# Patient Record
Sex: Male | Born: 1990 | Race: White | Hispanic: No | Marital: Married | Smoking: Former smoker
Health system: Southern US, Community
[De-identification: ages and names within clinical notes are randomized; demographics above are authoritative.]

## PROBLEM LIST (undated history)

## (undated) DIAGNOSIS — E063 Autoimmune thyroiditis: Secondary | ICD-10-CM

## (undated) DIAGNOSIS — J189 Pneumonia, unspecified organism: Secondary | ICD-10-CM

## (undated) DIAGNOSIS — M109 Gout, unspecified: Secondary | ICD-10-CM

## (undated) DIAGNOSIS — Z87442 Personal history of urinary calculi: Secondary | ICD-10-CM

## (undated) DIAGNOSIS — L0591 Pilonidal cyst without abscess: Secondary | ICD-10-CM

## (undated) DIAGNOSIS — I10 Essential (primary) hypertension: Secondary | ICD-10-CM

## (undated) DIAGNOSIS — F419 Anxiety disorder, unspecified: Secondary | ICD-10-CM

## (undated) DIAGNOSIS — R519 Headache, unspecified: Secondary | ICD-10-CM

## (undated) HISTORY — PX: WISDOM TOOTH EXTRACTION: SHX21

---

## 2018-04-11 ENCOUNTER — Encounter: Payer: Self-pay | Admitting: "Endocrinology

## 2018-04-11 ENCOUNTER — Ambulatory Visit (INDEPENDENT_AMBULATORY_CARE_PROVIDER_SITE_OTHER): Payer: Managed Care, Other (non HMO) | Admitting: "Endocrinology

## 2018-04-11 VITALS — BP 125/80 | HR 92 | Ht 74.25 in | Wt 338.0 lb

## 2018-04-11 DIAGNOSIS — E0590 Thyrotoxicosis, unspecified without thyrotoxic crisis or storm: Secondary | ICD-10-CM | POA: Insufficient documentation

## 2018-04-11 MED ORDER — PROPRANOLOL HCL 20 MG PO TABS: 20.0000 mg | ORAL_TABLET | Freq: Two times a day (BID) | ORAL | 2 refills | Status: DC

## 2018-04-11 NOTE — Patient Instructions (Signed)

## 2018-04-11 NOTE — Progress Notes (Signed)
Endocrinology Consult Note                                            04/11/2018, 3:39 PM   Subjective:    Patient ID: Geoffrey Dalton, male    DOB: 1991-07-11, PCP Vilinda Boehringer, MD   History reviewed. No pertinent past medical history. History reviewed. No pertinent surgical history. Social History   Socioeconomic History  . Marital status: Married    Spouse name: Not on file  . Number of children: Not on file  . Years of education: Not on file  . Highest education level: Not on file  Occupational History  . Not on file  Social Needs  . Financial resource strain: Not on file  . Food insecurity:    Worry: Not on file    Inability: Not on file  . Transportation needs:    Medical: Not on file    Non-medical: Not on file  Tobacco Use  . Smoking status: Former Smoker    Packs/day: 1.00    Years: 9.00    Pack years: 9.00    Types: Cigarettes    Last attempt to quit: 10/12/2012    Years since quitting: 5.4  . Smokeless tobacco: Never Used  Substance and Sexual Activity  . Alcohol use: Not on file  . Drug use: Not on file  . Sexual activity: Not on file  Lifestyle  . Physical activity:    Days per week: Not on file    Minutes per session: Not on file  . Stress: Not on file  Relationships  . Social connections:    Talks on phone: Not on file    Gets together: Not on file    Attends religious service: Not on file    Active member of club or organization: Not on file    Attends meetings of clubs or organizations: Not on file    Relationship status: Not on file  Other Topics Concern  . Not on file  Social History Narrative  . Not on file   Outpatient Encounter Medications as of 04/11/2018  Medication Sig  . amphetamine-dextroamphetamine (ADDERALL) 30 MG tablet daily.  . propranolol (INDERAL) 20 MG tablet Take 1 tablet (20 mg total) by mouth 2 (two) times daily.  . sertraline (ZOLOFT) 100 MG tablet daily.   No facility-administered encounter medications on  file as of 04/11/2018.    ALLERGIES: Allergies  Allergen Reactions  . Oxycodone-Acetaminophen Anaphylaxis  . Morphine And Related     Other reaction(s): Other (See Comments) Respiratory Depression Respiratory Depression     VACCINATION STATUS:  There is no immunization history on file for this patient.  HPI Geoffrey Dalton is 27 y.o. male who presents today with a medical history as above. he is being seen in consultation for hyperthyroidism requested by Vilinda Boehringer, MD.  he has been dealing with symptoms of palpitations, heat intolerance, tremors on and off for approximately 18 months. -He has gained significant amount of weight, approximately 50 pounds over a year. -He has family history of thyroid dysfunction in multiple family members including his mother, grandparents, and great aunts. -He was diagnosed with hyperthyroidism with Graves' disease in [facility] in 2018, did not require antithyroid ablative treatment except brief period of beta-blocker treatment. -He does not have recent thyroid function tests to review.  In  May 2018 he did have T3 toxicosis with free T3 of 3.81, free T4 0.7, TSH 2.8.  -He denies dysphagia, shortness of breath, nor voice change.  -  He was found to have multinodular goiter based on ultrasound in April 2018-summarized below.  Review of Systems  Constitutional: + weight gain, no fatigue, + subjective hyperthermia, no subjective hypothermia Eyes: no blurry vision, no xerophthalmia ENT: no sore throat, no nodules palpated in throat, no dysphagia/odynophagia, no hoarseness Cardiovascular: no Chest Pain, no Shortness of Breath, + palpitations, no leg swelling Respiratory: no cough, no SOB Gastrointestinal: no Nausea/Vomiting/Diarhhea Musculoskeletal: no muscle/joint aches Skin: no rashes Neurological: no tremors, no numbness, no tingling, no dizziness Psychiatric: no depression, + anxiety  Objective:    BP 125/80   Pulse 92    Ht 6' 2.25" (1.886 m)   Wt (!) 338 lb (153.3 kg)   BMI 43.10 kg/m   Wt Readings from Last 3 Encounters:  04/11/18 (!) 338 lb (153.3 kg)    Physical Exam  Constitutional: + Obese,, not in acute distress, normal state of mind Eyes: PERRLA, EOMI, no exophthalmos ENT: moist mucous membranes, no thyromegaly, no cervical lymphadenopathy Cardiovascular: normal precordial activity, + mild tachycardia, no Murmur/Rubs/Gallops Respiratory:  adequate breathing efforts, no gross chest deformity, Clear to auscultation bilaterally Gastrointestinal: abdomen soft, Non -tender, No distension, Bowel Sounds present Musculoskeletal: no gross deformities, strength intact in all four extremities Skin: moist, warm, no rashes Neurological: + tremor with outstretched hands, Deep tendon reflexes are brisk in bilateral lower extremities.   Dec 20, 2016 free T3 elevated 3.81, TSH normal 2.8, free T4 low normal 0.7. TPO antibodies elevated at 121, thyroglobulin but is elevated at 76.  February 01, 2017 thyroid uptake and scan showed 20-hour capture of 21.7% (normal 20-30%). Ultrasound in November 30, 2016 showed right lobe 5.2 cm with 2 nodules measuring 1.4 cm and 1.2 cm.  Left lobe measured 4.5 cm with no nodules.   Assessment & Plan:   1. Hyperthyroidism  This pleasant patient is being seen at the kind request of Dr. Vilinda BoehringerJames Short. His history and available remote labs are reviewed. He was examined clinically. Subjective and objective findings are consistent with thyrotoxicosis likely from primary hyperthyroidism.   The potential risks of untreated thyrotoxicosis and the need for definitive therapy have been discussed  with him, and he agrees to proceed with diagnostic workup and treatment plan.   I like to repeat full profile thyroid function tests today and he may need confirmatory thyroid uptake if the labs are suggestive.      Options of therapy for hyperthyroidism are briefly discussed with him.   -If he is  confirmed to have primary hyperthyroidism the option of definitive therapy with RAI ablation of the thyroid is best for him.  -In the meantime, I discussed and prescribed propranolol 20 mg p.o. twice daily for symptomatic relief.  -Regarding his significant weight gain, which is likely related to his hyperthyroidism, I discussed lifestyle modifications with him.  -  Suggestion is made for him to avoid simple carbohydrates  from his diet including Cakes, Sweet Desserts / Pastries, Ice Cream, Soda (diet and regular), Sweet Tea, Candies, Chips, Cookies, Store Bought Juices, Alcohol in Excess of  1-2 drinks a day, Artificial Sweeteners, and "Sugar-free" Products. This will help patient to have stable blood glucose profile and potentially avoid unintended weight gain.  Overall signs of his thyroid is appropriate for his body size, however if he does not have hyperthyroidism, he will  need repeat thyroid ultrasound to monitor the size of the 2 nodules in the right lobe of his thyroid.  - I advised him  to maintain close follow up with Vilinda Boehringer, MD for primary care needs.  Geoffrey Dalton participated in the discussions, expressed understanding, and voiced agreement with the above plans.  All questions were answered to his satisfaction. he is encouraged to contact clinic should he have any questions or concerns prior to his return visit.  Follow up plan: Return in about 2 weeks (around 04/25/2018) for Labs Today- Non-Fasting Ok.   Marquis Lunch, MD [facility] [address] [place], [place] [address] Phone: [phone]  Fax: [phone]     04/11/2018, 3:39 PM  This note was partially dictated with voice recognition software. Similar sounding words can be transcribed inadequately or may not  be corrected upon review.

## 2018-04-12 ENCOUNTER — Other Ambulatory Visit: Payer: Self-pay | Admitting: "Endocrinology

## 2018-04-13 LAB — TSH: TSH: 3.05 u[IU]/mL (ref 0.450–4.500)

## 2018-04-13 LAB — T3, FREE: T3 FREE: 3.4 pg/mL (ref 2.0–4.4)

## 2018-04-13 LAB — T4, FREE: FREE T4: 1.06 ng/dL (ref 0.82–1.77)

## 2018-04-25 ENCOUNTER — Encounter: Payer: Self-pay | Admitting: "Endocrinology

## 2018-04-25 ENCOUNTER — Ambulatory Visit (INDEPENDENT_AMBULATORY_CARE_PROVIDER_SITE_OTHER): Payer: Managed Care, Other (non HMO) | Admitting: "Endocrinology

## 2018-04-25 VITALS — BP 137/83 | HR 73 | Ht 74.25 in | Wt 333.0 lb

## 2018-04-25 DIAGNOSIS — E0590 Thyrotoxicosis, unspecified without thyrotoxic crisis or storm: Secondary | ICD-10-CM

## 2018-04-25 NOTE — Progress Notes (Signed)
Endocrinology follow-up  Note                                            04/25/2018, 4:45 PM   Subjective:    Patient ID: Geoffrey Dalton, male    DOB: 1991/06/09, PCP Vilinda Boehringer, MD   History reviewed. No pertinent past medical history. History reviewed. No pertinent surgical history. Social History   Socioeconomic History  . Marital status: Married    Spouse name: Not on file  . Number of children: Not on file  . Years of education: Not on file  . Highest education level: Not on file  Occupational History  . Not on file  Social Needs  . Financial resource strain: Not on file  . Food insecurity:    Worry: Not on file    Inability: Not on file  . Transportation needs:    Medical: Not on file    Non-medical: Not on file  Tobacco Use  . Smoking status: Former Smoker    Packs/day: 1.00    Years: 9.00    Pack years: 9.00    Types: Cigarettes    Last attempt to quit: 10/12/2012    Years since quitting: 5.5  . Smokeless tobacco: Never Used  Substance and Sexual Activity  . Alcohol use: Not Currently  . Drug use: Never  . Sexual activity: Not on file  Lifestyle  . Physical activity:    Days per week: Not on file    Minutes per session: Not on file  . Stress: Not on file  Relationships  . Social connections:    Talks on phone: Not on file    Gets together: Not on file    Attends religious service: Not on file    Active member of club or organization: Not on file    Attends meetings of clubs or organizations: Not on file    Relationship status: Not on file  Other Topics Concern  . Not on file  Social History Narrative  . Not on file   Outpatient Encounter Medications as of 04/25/2018  Medication Sig  . amphetamine-dextroamphetamine (ADDERALL) 30 MG tablet daily.  . propranolol (INDERAL) 20 MG tablet Take 1 tablet (20 mg total) by mouth 2 (two) times daily.  . sertraline (ZOLOFT) 100 MG tablet daily.   No facility-administered encounter medications on  file as of 04/25/2018.    ALLERGIES: Allergies  Allergen Reactions  . Oxycodone-Acetaminophen Anaphylaxis  . Morphine And Related     Other reaction(s): Other (See Comments) Respiratory Depression Respiratory Depression     VACCINATION STATUS:  There is no immunization history on file for this patient.  HPI Geoffrey Dalton is 27 y.o. male who presents today with a medical history as above. he is returning with thyroid function tests after he was seen in consultation for history of hyperthyroidism requested by Vilinda Boehringer, MD.  he has been dealing with symptoms of palpitations, heat intolerance, tremors on and off for approximately 18 months.  Please symptoms have largely been subsiding. -He has lost approximately 5 pounds since last visit. -He has family history of thyroid dysfunction in multiple family members including his mother, grandparents, and great aunts. -He was diagnosed with hyperthyroidism with Graves' disease in [facility] in 2018, did not require antithyroid ablative treatment except brief period of beta-blocker treatment. His 24-hour  thyroid uptake was 21%. -His previsit thyroid function tests are within normal limits. -Continues to complain of fatigue, inability to lose weight, diffuse arthralgias.  -He denies dysphagia, shortness of breath, nor voice change.  -  He was found to have multinodular goiter based on ultrasound in April 2018-summarized below.  Review of Systems  Constitutional: + weight loss after significant history of weight gain over the past several months,  + fatigue, + subjective hyperthermia, no subjective hypothermia Eyes: no blurry vision, no xerophthalmia ENT: no sore throat, no nodules palpated in throat, no dysphagia/odynophagia, no hoarseness Cardiovascular: no Chest Pain, no Shortness of Breath, + palpitations, no leg swelling Respiratory: no cough, no SOB Gastrointestinal: no Nausea/Vomiting/Diarhhea Musculoskeletal:  no muscle/joint aches Skin: no rashes Neurological: no tremors, no numbness, no tingling, no dizziness Psychiatric: no depression, + anxiety  Objective:    BP 137/83   Pulse 73   Ht 6' 2.25" (1.886 m)   Wt (!) 333 lb (151 kg)   BMI 42.47 kg/m   Wt Readings from Last 3 Encounters:  04/25/18 (!) 333 lb (151 kg)  04/11/18 (!) 338 lb (153.3 kg)    Physical Exam  Constitutional: + Obese,, not in acute distress, normal state of mind Eyes: PERRLA, EOMI, no exophthalmos ENT: moist mucous membranes, no thyromegaly, no cervical lymphadenopathy  Musculoskeletal: no gross deformities, strength intact in all four extremities Skin: moist, warm, no rashes Neurological: + tremor with outstretched hands, Deep tendon reflexes are brisk in bilateral lower extremities.   Dec 20, 2016 free T3 elevated 3.81, TSH normal 2.8, free T4 low normal 0.7. TPO antibodies elevated at 121, thyroglobulin but is elevated at 76.  February 01, 2017 thyroid uptake and scan showed 20-hour capture of 21.7% (normal 20-30%). Ultrasound in November 30, 2016 showed right lobe 5.2 cm with 2 nodules measuring 1.4 cm and 1.2 cm.  Left lobe measured 4.5 cm with no nodules. Recent Results (from the past 2160 hour(s))  T4, free     Status: None   Collection Time: 04/12/18 11:44 AM  Result Value Ref Range   Free T4 1.06 0.82 - 1.77 ng/dL  TSH     Status: None   Collection Time: 04/12/18 11:44 AM  Result Value Ref Range   TSH 3.050 0.450 - 4.500 uIU/mL  T3, free     Status: None   Collection Time: 04/12/18 11:44 AM  Result Value Ref Range   T3, Free 3.4 2.0 - 4.4 pg/mL     Assessment & Plan:   1. Hyperthyroidism-history of -His previsit labs are within normal limits. -He has previous evidence of antithyroid antibodies with elevated TPO and thyroglobulin antibodies. -His clinical course appears to include transient phase of thyrotoxicosis which has progressed to euthyroidism at this time. -Based on his thyroid function  tests, he would not require antithyroid intervention at this time.  I approached him to keep an appointment with repeat thyroid function test in 4 months.  -Regarding his fatigue, I had offered him screening for hypogonadism with fasting a.m. testosterone as well as A1c and 25 hydroxy vitamin D levels. - If these labs are abnormal, he would be called for intervention if necessary.    -Regarding his significant weight gain, which is likely related to his hyperthyroidism, I discussed lifestyle modifications with him.  -  Suggestion is made for him to avoid simple carbohydrates  from his diet including Cakes, Sweet Desserts / Pastries, Ice Cream, Soda (diet and regular), Sweet Tea, Candies, Chips, Cookies, Store Bought  Juices, Alcohol in Excess of  1-2 drinks a day, Artificial Sweeteners, and "Sugar-free" Products. This will help patient to have stable blood glucose profile and potentially avoid unintended weight gain.  Overall size of his thyroid is appropriate for his body size, he will need repeat thyroid ultrasound to monitor the size of the 2 nodules in the right lobe of his thyroid in 1 year.  - I advised him  to maintain close follow up with Vilinda Boehringer, MD for primary care needs.    Follow up plan: Return in about 4 months (around 08/25/2018), or Separate labs this week fasting, for Follow up with Pre-visit Labs.   Marquis Lunch, MD [facility] [address] [place], [place] [address] Phone: [phone]  Fax: [phone]     04/25/2018, 4:45 PM  This note was partially dictated with voice recognition software. Similar sounding words can be transcribed inadequately or may not  be corrected upon review.

## 2018-04-25 NOTE — Patient Instructions (Signed)

## 2018-04-26 ENCOUNTER — Other Ambulatory Visit: Payer: Self-pay | Admitting: "Endocrinology

## 2018-04-27 LAB — VITAMIN D 25 HYDROXY (VIT D DEFICIENCY, FRACTURES): VIT D 25 HYDROXY: 29.2 ng/mL — AB (ref 30.0–100.0)

## 2018-04-27 LAB — COMPREHENSIVE METABOLIC PANEL
A/G RATIO: 1.5 (ref 1.2–2.2)
ALT: 30 IU/L (ref 0–44)
AST: 27 IU/L (ref 0–40)
Albumin: 4.4 g/dL (ref 3.5–5.5)
Alkaline Phosphatase: 51 IU/L (ref 39–117)
BILIRUBIN TOTAL: 0.4 mg/dL (ref 0.0–1.2)
BUN/Creatinine Ratio: 15 (ref 9–20)
BUN: 18 mg/dL (ref 6–20)
CHLORIDE: 99 mmol/L (ref 96–106)
CO2: 23 mmol/L (ref 20–29)
Calcium: 9.4 mg/dL (ref 8.7–10.2)
Creatinine, Ser: 1.17 mg/dL (ref 0.76–1.27)
GFR calc non Af Amer: 84 mL/min/{1.73_m2} (ref >59)
GFR, EST AFRICAN AMERICAN: 98 mL/min/{1.73_m2} (ref >59)
Globulin, Total: 3 g/dL (ref 1.5–4.5)
Glucose: 102 mg/dL — ABNORMAL HIGH (ref 65–99)
POTASSIUM: 4 mmol/L (ref 3.5–5.2)
Sodium: 139 mmol/L (ref 134–144)
Total Protein: 7.4 g/dL (ref 6.0–8.5)

## 2018-04-27 LAB — LIPID PANEL W/O CHOL/HDL RATIO
CHOLESTEROL TOTAL: 165 mg/dL (ref 100–199)
HDL: 39 mg/dL — AB (ref >39)
LDL Calculated: 94 mg/dL (ref 0–99)
TRIGLYCERIDES: 158 mg/dL — AB (ref 0–149)
VLDL Cholesterol Cal: 32 mg/dL (ref 5–40)

## 2018-04-27 LAB — HGB A1C W/O EAG: Hgb A1c MFr Bld: 5.4 % (ref 4.8–5.6)

## 2018-08-29 ENCOUNTER — Ambulatory Visit: Payer: Managed Care, Other (non HMO) | Admitting: "Endocrinology

## 2020-08-18 ENCOUNTER — Other Ambulatory Visit: Payer: Self-pay

## 2020-08-18 ENCOUNTER — Emergency Department
Admission: EM | Admit: 2020-08-18 | Discharge: 2020-08-18 | Disposition: A | Discharge status: Home / Self Care | Payer: Managed Care, Other (non HMO) | Attending: Emergency Medicine | Admitting: Emergency Medicine

## 2020-08-18 ENCOUNTER — Encounter: Payer: Self-pay | Admitting: Emergency Medicine

## 2020-08-18 ENCOUNTER — Emergency Department: Payer: Managed Care, Other (non HMO)

## 2020-08-18 DIAGNOSIS — U071 COVID-19: Secondary | ICD-10-CM | POA: Diagnosis not present

## 2020-08-18 DIAGNOSIS — R059 Cough, unspecified: Secondary | ICD-10-CM | POA: Diagnosis present

## 2020-08-18 DIAGNOSIS — Z87891 Personal history of nicotine dependence: Secondary | ICD-10-CM | POA: Insufficient documentation

## 2020-08-18 DIAGNOSIS — E039 Hypothyroidism, unspecified: Secondary | ICD-10-CM | POA: Insufficient documentation

## 2020-08-18 DIAGNOSIS — J111 Influenza due to unidentified influenza virus with other respiratory manifestations: Secondary | ICD-10-CM

## 2020-08-18 HISTORY — DX: Autoimmune thyroiditis: E06.3

## 2020-08-18 LAB — CBC
HCT: 48.3 % (ref 39.0–52.0)
Hemoglobin: 15.7 g/dL (ref 13.0–17.0)
MCH: 29.1 pg (ref 26.0–34.0)
MCHC: 32.5 g/dL (ref 30.0–36.0)
MCV: 89.6 fL (ref 80.0–100.0)
Platelets: 150 10*3/uL (ref 150–400)
RBC: 5.39 MIL/uL (ref 4.22–5.81)
RDW: 12.7 % (ref 11.5–15.5)
WBC: 4.4 10*3/uL (ref 4.0–10.5)
nRBC: 0 % (ref 0.0–0.2)

## 2020-08-18 LAB — BASIC METABOLIC PANEL
Anion gap: 8 (ref 5–15)
BUN: 13 mg/dL (ref 6–20)
CO2: 28 mmol/L (ref 22–32)
Calcium: 8.7 mg/dL — ABNORMAL LOW (ref 8.9–10.3)
Chloride: 102 mmol/L (ref 98–111)
Creatinine, Ser: 1.09 mg/dL (ref 0.61–1.24)
GFR, Estimated: >60 mL/min (ref >60)
Glucose, Bld: 126 mg/dL — ABNORMAL HIGH (ref 70–99)
Potassium: 4.1 mmol/L (ref 3.5–5.1)
Sodium: 138 mmol/L (ref 135–145)

## 2020-08-18 LAB — TROPONIN I (HIGH SENSITIVITY): Troponin I (High Sensitivity): 6 ng/L (ref <18)

## 2020-08-18 IMAGING — CR DG CHEST 2V
1 series · 2 of 2 positions shown · non-contrast
Comparison: None.

CLINICAL DATA: Chest pain and fever.  [6I] positive

EXAM:
CHEST - 2 VIEW

[Series 1: w chest pa · 0.14mm/px · 2 of 2 slices shown]
[im 1/2]
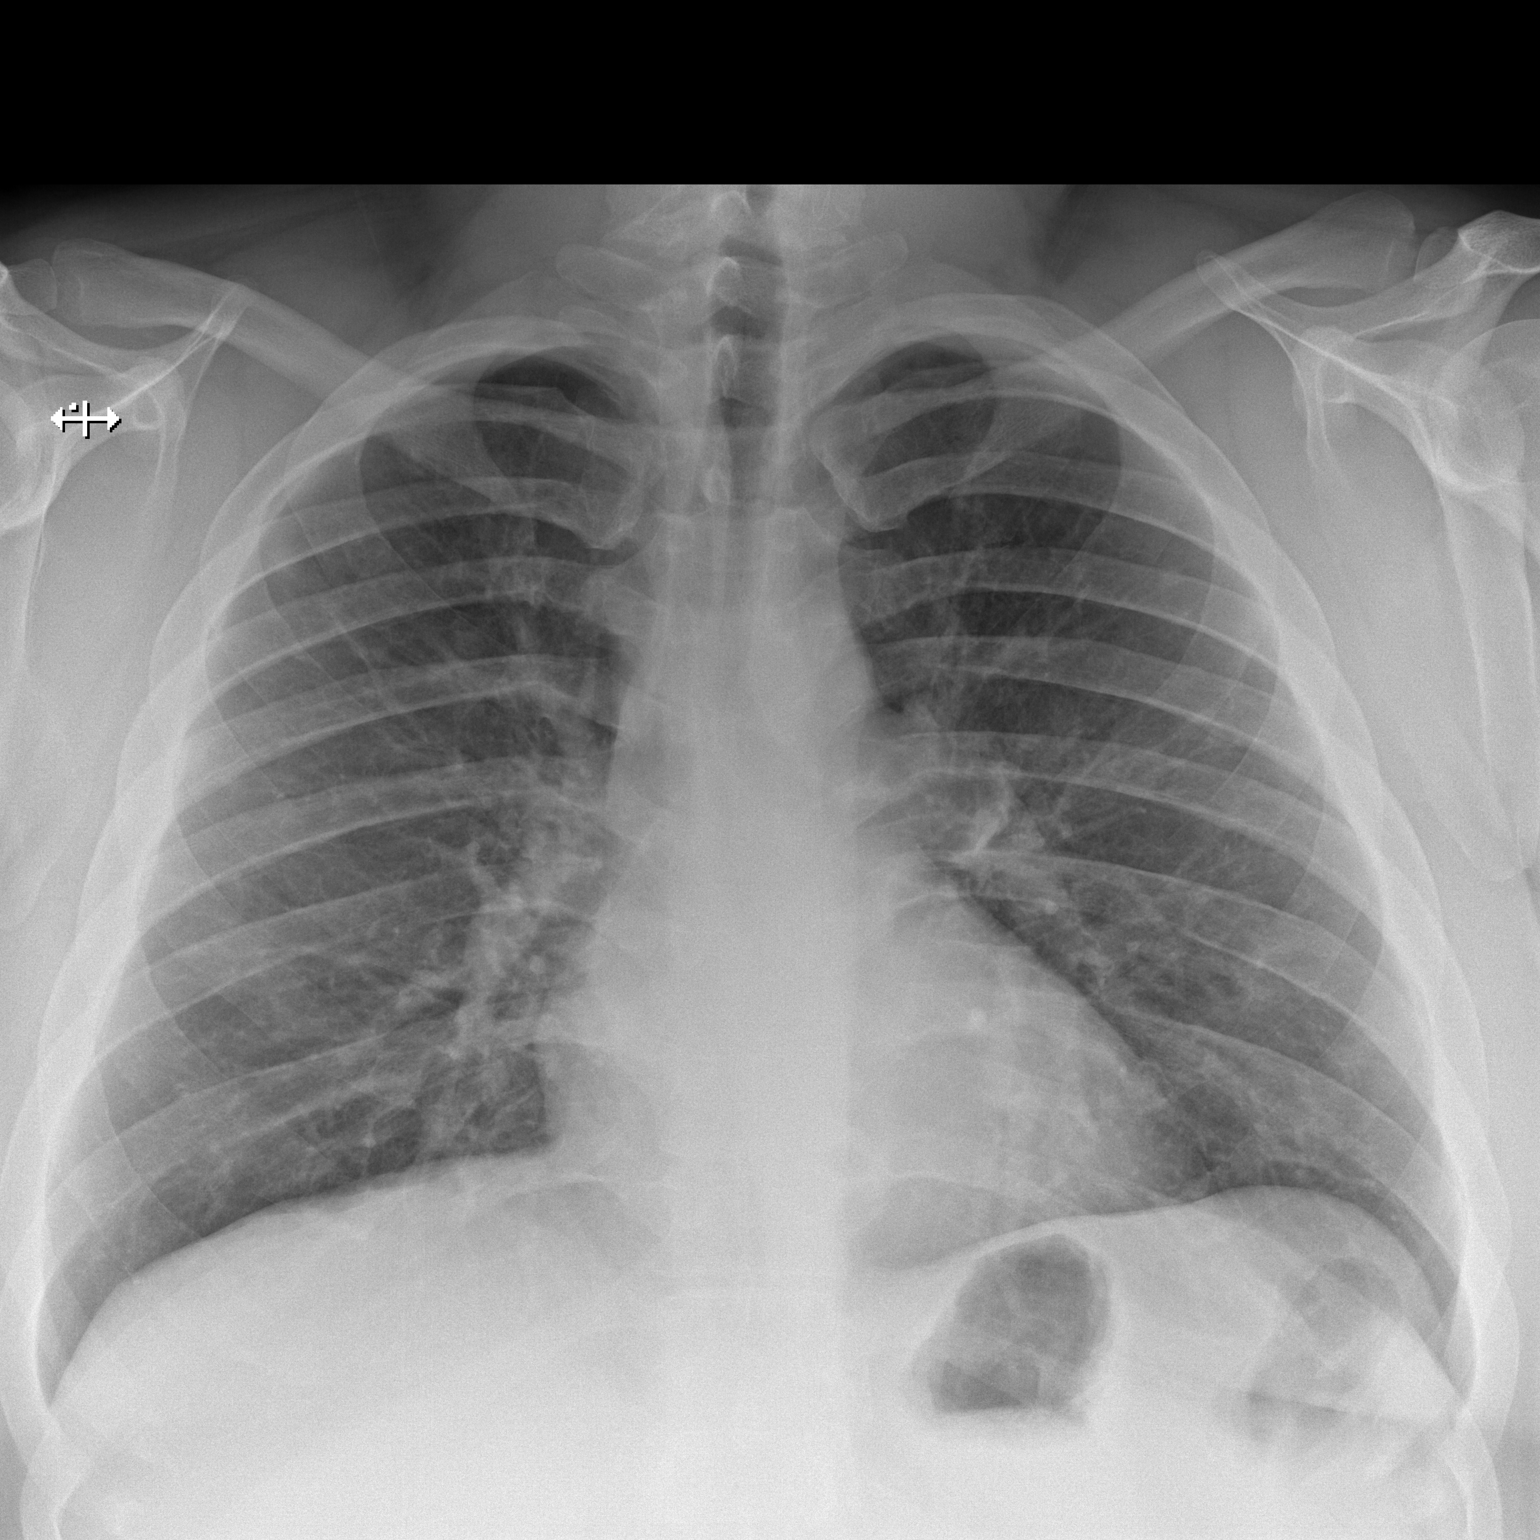
[im 2/2]
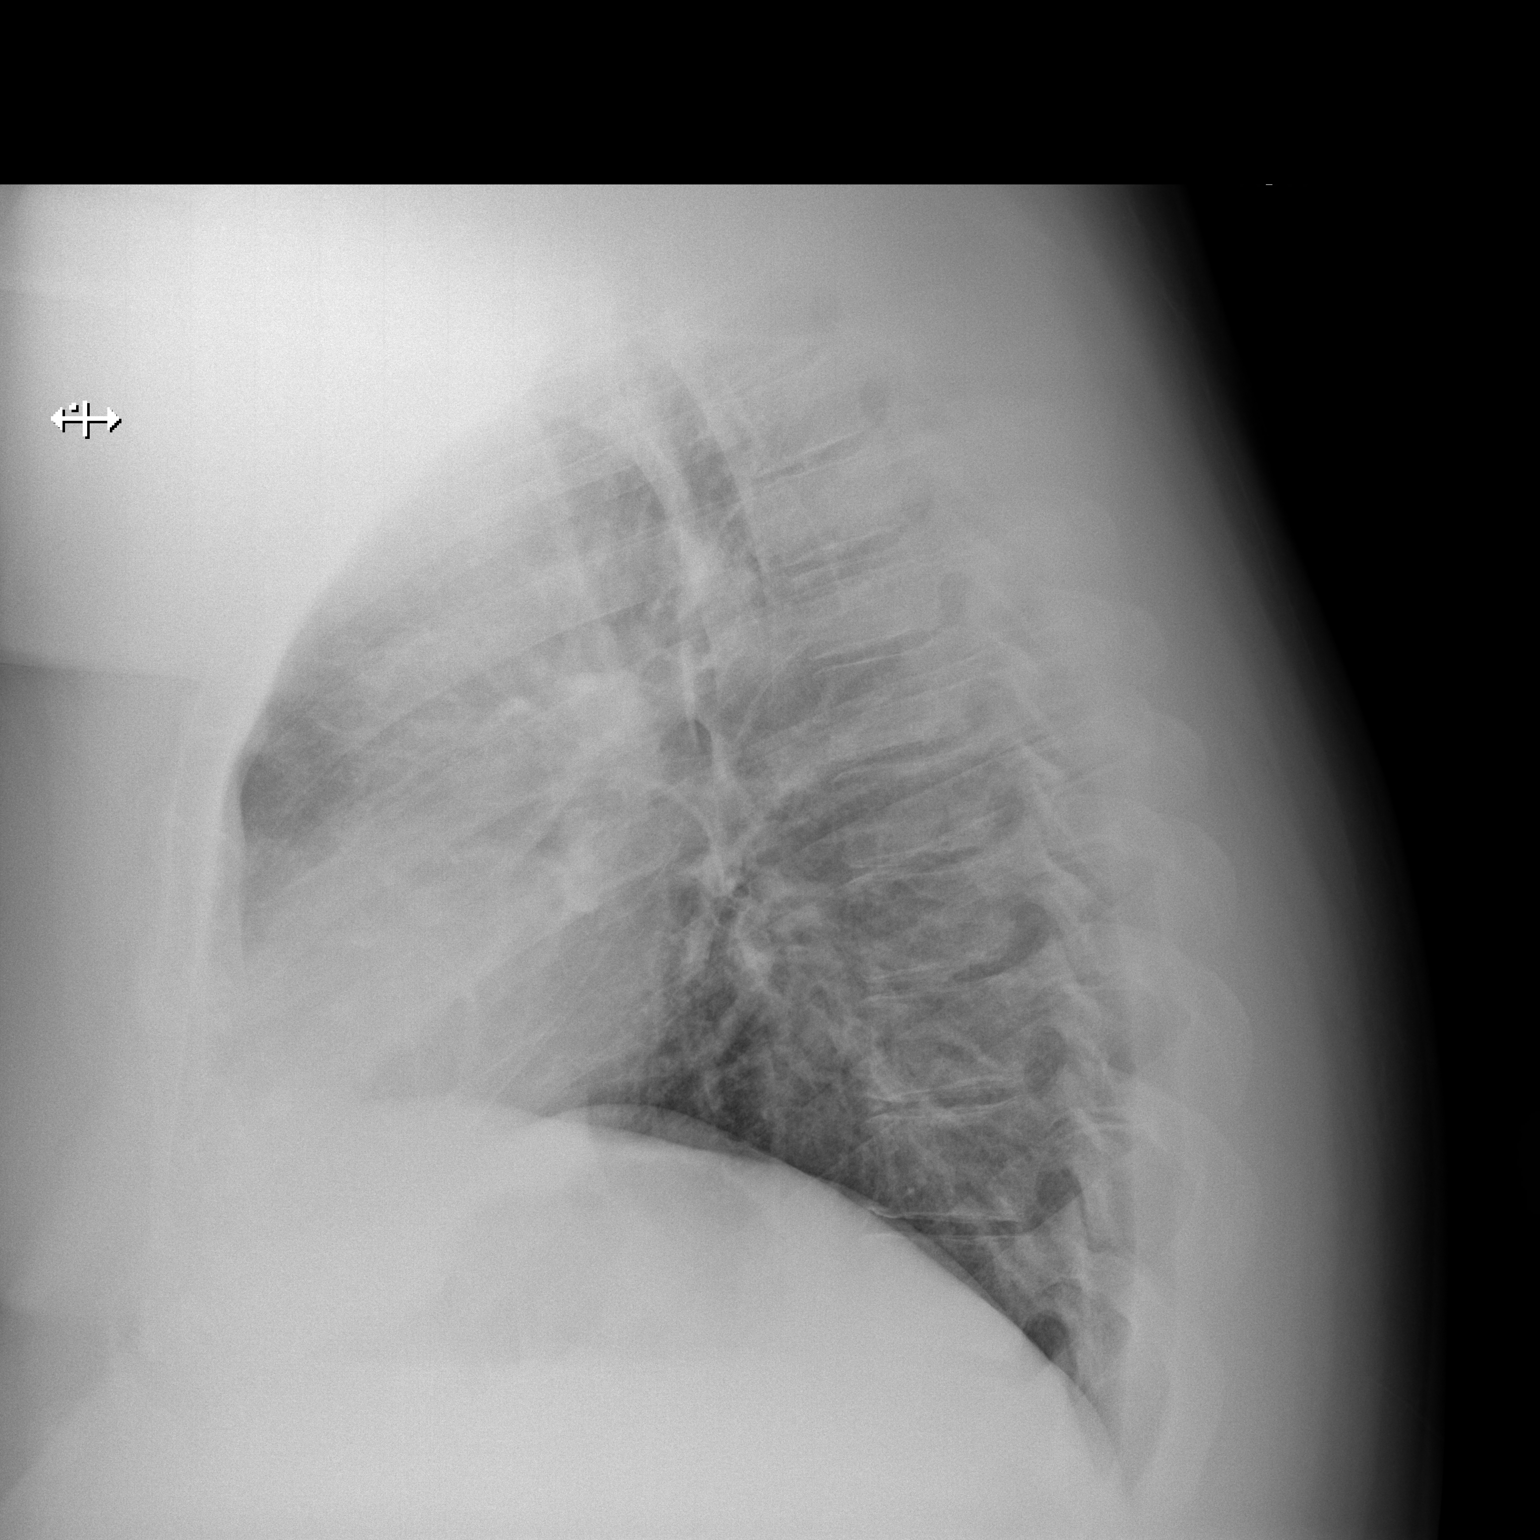

[2 of 2 positions shown; findings below may reference images not displayed]

FINDINGS: Lungs are clear. Heart size and pulmonary vascularity are normal. No
adenopathy. No bone lesions.
IMPRESSION: Lungs clear.  Cardiac silhouette normal

## 2020-08-18 MED ORDER — FAMOTIDINE 20 MG PO TABS: 20.0000 mg | ORAL_TABLET | Freq: Two times a day (BID) | ORAL | 0 refills | Status: DC

## 2020-08-18 MED ORDER — ONDANSETRON 4 MG PO TBDP: 4.0000 mg | ORAL_TABLET | Freq: Three times a day (TID) | ORAL | 0 refills | Status: DC | PRN

## 2020-08-18 MED ORDER — PREDNISONE 20 MG PO TABS: 40.0000 mg | ORAL_TABLET | Freq: Every day | ORAL | 0 refills | Status: DC

## 2020-08-18 MED ORDER — ALBUTEROL SULFATE HFA 108 (90 BASE) MCG/ACT IN AERS: 2.0000 | INHALATION_SPRAY | RESPIRATORY_TRACT | 0 refills | Status: DC | PRN

## 2020-08-18 NOTE — ED Provider Notes (Signed)
[facility] Emergency Department Provider Note  ____________________________________________  Time seen: Approximately 12:55 PM  I have reviewed the triage vital signs and the nursing notes.   HISTORY  Chief Complaint Chest Pain, Generalized Body Aches, and Fever    HPI Geoffrey Dalton is a 30 y.o. male with a past medical history of Hashimoto's thyroiditis who comes ED complaining of central chest pain with coughing described as dull ache, nonproductive cough, generalized body aches, recurrent fever as high as 103.  Also mild shortness of breath, fatigue.  No pleuritic pain.  No recent travel trauma hospitalization or surgery, no history of DVT or PE, no hormone use.  Reports a positive home Covid test on December 26.  His family member recently tested positive as well.  Not currently on any thyroid suppressant medication, no prior surgery or thyroid ablation.  Reports that his symptoms wax and wane and he takes propranolol as needed.      Past Medical History:  Diagnosis Date  . Hashimoto's thyroiditis      Patient Active Problem List   Diagnosis Date Noted  . Hyperthyroidism 04/11/2018     History reviewed. No pertinent surgical history.   Prior to Admission medications   Medication Sig Start Date End Date Taking? Authorizing Provider  albuterol (PROVENTIL HFA) 108 (90 Base) MCG/ACT inhaler Inhale 2 puffs into the lungs every 4 (four) hours as needed for wheezing or shortness of breath. 08/18/20  Yes Sharman Cheek, MD  famotidine (PEPCID) 20 MG tablet Take 1 tablet (20 mg total) by mouth 2 (two) times daily. 08/18/20  Yes Sharman Cheek, MD  ondansetron (ZOFRAN ODT) 4 MG disintegrating tablet Take 1 tablet (4 mg total) by mouth every 8 (eight) hours as needed for nausea or vomiting. 08/18/20  Yes Sharman Cheek, MD  predniSONE (DELTASONE) 20 MG tablet Take 2 tablets (40 mg total) by mouth daily. 08/18/20  Yes Sharman Cheek, MD   amphetamine-dextroamphetamine (ADDERALL) 30 MG tablet daily. 02/12/18   [provider]  propranolol (INDERAL) 20 MG tablet Take 1 tablet (20 mg total) by mouth 2 (two) times daily. 04/11/18   Roma Kayser, MD  sertraline (ZOLOFT) 100 MG tablet daily. 03/13/18   [provider]     Allergies Oxycodone-acetaminophen and Morphine and related   No family history on file.  Social History Social History   Tobacco Use  . Smoking status: Former Smoker    Packs/day: 1.00    Years: 9.00    Pack years: 9.00    Types: Cigarettes    Quit date: 10/12/2012    Years since quitting: 7.8  . Smokeless tobacco: Never Used  Vaping Use  . Vaping Use: Never used  Substance Use Topics  . Alcohol use: Not Currently  . Drug use: Never    Review of Systems  Constitutional: Positive fever and chills.  ENT:   Positive sore throat. No rhinorrhea. Cardiovascular:   Positive chest pain without syncope. Respiratory:   Positive dyspnea and nonproductive cough. Gastrointestinal:   Negative for abdominal pain, vomiting and diarrhea.  Positive decreased appetite. Musculoskeletal:   Negative for focal pain or swelling All other systems reviewed and are negative except as documented above in ROS and HPI.  ____________________________________________   PHYSICAL EXAM:  VITAL SIGNS: ED Triage Vitals  Enc Vitals Group     BP 08/18/20 1206 130/85     Pulse Rate 08/18/20 1206 (!) 101     Resp 08/18/20 1206 20     Temp  08/18/20 1206 99.2 F (37.3 C)     Temp Source 08/18/20 1206 Oral     SpO2 08/18/20 1206 94 %     Weight 08/18/20 1200 (!) 350 lb (158.8 kg)     Height 08/18/20 1200 6\' 4"  (1.93 m)     Head Circumference --      Peak Flow --      Pain Score 08/18/20 1200 5     Pain Loc --      Pain Edu? --      Excl. in [place]? --     Vital signs reviewed, nursing assessments reviewed.   Constitutional:   Alert and oriented. Non-toxic appearance. Eyes:   Conjunctivae are  normal. EOMI. PERRL. ENT      Head:   Normocephalic and atraumatic.      Nose: Normal      Mouth/Throat:   Moist mucosa, no tonsillar swelling or exudate      Neck:   No meningismus. Full ROM. Hematological/Lymphatic/Immunilogical:   No cervical lymphadenopathy. Cardiovascular:   RRR. Symmetric bilateral radial and DP pulses.  No murmurs. Cap refill less than 2 seconds. Respiratory:   Normal respiratory effort without tachypnea/retractions. Breath sounds are clear and equal bilaterally. No wheezes/rales/rhonchi with normal breathing, there is inducible cough and wheezing with FEV1 maneuver. Gastrointestinal:   Soft and nontender. Non distended. There is no CVA tenderness.  No rebound, rigidity, or guarding.  Musculoskeletal:   Normal range of motion in all extremities. No joint effusions.  No lower extremity tenderness.  No edema. Neurologic:   Normal speech and language.  Motor grossly intact. No acute focal neurologic deficits are appreciated.  Skin:    Skin is warm, dry and intact. No rash noted.  No petechiae, purpura, or bullae.  ____________________________________________    LABS (pertinent positives/negatives) (all labs ordered are listed, but only abnormal results are displayed) Labs Reviewed  BASIC METABOLIC PANEL - Abnormal; Notable for the following components:      Result Value   Glucose, Bld 126 (*)    Calcium 8.7 (*)    All other components within normal limits  CBC  TROPONIN I (HIGH SENSITIVITY)   ____________________________________________   EKG  Interpreted by me Sinus tachycardia rate 106.  Normal axis and intervals.  Normal QRS ST segments and T waves.  No ischemic changes, not consistent with right heart strain.  ____________________________________________    RADIOLOGY  DG Chest 2 View  Result Date: 08/18/2020 CLINICAL DATA:  Chest pain and fever.  COVID-19 positive EXAM: CHEST - 2 VIEW COMPARISON:  None. FINDINGS: Lungs are clear. Heart size and  pulmonary vascularity are normal. No adenopathy. No bone lesions. IMPRESSION: Lungs clear.  Cardiac silhouette normal Electronically Signed   By: 10/16/2020 III M.D.   On: 08/18/2020 12:26    ____________________________________________   PROCEDURES Procedures  ____________________________________________  DIFFERENTIAL DIAGNOSIS Dehydration, electrolyte abnormality, pneumonia, pleural effusion, COVID-19 viral syndrome    CLINICAL IMPRESSION / ASSESSMENT AND PLAN / ED COURSE  Medications ordered in the ED: Medications - No data to display  Pertinent labs & imaging results that were available during my care of the patient were reviewed by me and considered in my medical decision making (see chart for details).  Geoffrey Dalton was evaluated in Emergency Department on 08/18/2020 for the symptoms described in the history of present illness. He was evaluated in the context of the global COVID-19 pandemic, which necessitated consideration that the patient might be at risk for infection with  the SARS-CoV-2 virus that causes COVID-19. Institutional protocols and algorithms that pertain to the evaluation of patients at risk for COVID-19 are in a state of rapid change based on information released by regulatory bodies including the CDC and federal and state organizations. These policies and algorithms were followed during the patient's care in the ED.   Patient presents with multiple constitutional symptoms and pleurisy in the setting of known Covid diagnosis is a continuous illness over the past week. Considering the patient's symptoms, medical history, and physical examination today, I have low suspicion for ACS, PE, TAD, pneumothorax, carditis, mediastinitis, pneumonia, CHF, or sepsis.  Labs are normal, chest x-ray reviewed and interpreted by me which appears normal.  Radiology report agrees.  Vital signs are reassuring except for slight tachycardia which is expected given his viral syndrome  and likely mild dehydration.  He is nontoxic and stable for discharge home.  He is only been taking Tylenol so far.  We will add on ibuprofen, prednisone and albuterol for respiratory symptoms and prolonged inflammatory syndrome, Zofran and Pepcid for GI symptoms and prophylaxis.      ____________________________________________   FINAL CLINICAL IMPRESSION(S) / ED DIAGNOSES    Final diagnoses:  COVID-19 virus infection  Influenza-like illness     ED Discharge Orders         Ordered    predniSONE (DELTASONE) 20 MG tablet  Daily        08/18/20 1254    albuterol (PROVENTIL HFA) 108 (90 Base) MCG/ACT inhaler  Every 4 hours PRN        08/18/20 1254    ondansetron (ZOFRAN ODT) 4 MG disintegrating tablet  Every 8 hours PRN        08/18/20 1254    famotidine (PEPCID) 20 MG tablet  2 times daily        08/18/20 1254          Portions of this note were generated with dragon dictation software. Dictation errors may occur despite best attempts at proofreading.   Carrie Mew, MD 08/18/20 1259

## 2020-08-18 NOTE — ED Triage Notes (Signed)
Patient to ER for c/o chest pain, coughing, body aches, and fever (highest 103). Tested positive for Covid at home on 08/10/20.

## 2020-08-22 ENCOUNTER — Emergency Department
Admission: EM | Admit: 2020-08-22 | Discharge: 2020-08-22 | Disposition: A | Discharge status: Home / Self Care | Payer: Managed Care, Other (non HMO) | Attending: Emergency Medicine | Admitting: Emergency Medicine

## 2020-08-22 ENCOUNTER — Other Ambulatory Visit: Payer: Self-pay

## 2020-08-22 ENCOUNTER — Encounter: Payer: Self-pay | Admitting: Emergency Medicine

## 2020-08-22 ENCOUNTER — Emergency Department: Payer: Managed Care, Other (non HMO)

## 2020-08-22 DIAGNOSIS — Z79899 Other long term (current) drug therapy: Secondary | ICD-10-CM | POA: Diagnosis not present

## 2020-08-22 DIAGNOSIS — R0602 Shortness of breath: Secondary | ICD-10-CM | POA: Diagnosis present

## 2020-08-22 DIAGNOSIS — U071 COVID-19: Secondary | ICD-10-CM | POA: Diagnosis not present

## 2020-08-22 DIAGNOSIS — Z87891 Personal history of nicotine dependence: Secondary | ICD-10-CM | POA: Diagnosis not present

## 2020-08-22 DIAGNOSIS — J1282 Pneumonia due to coronavirus disease 2019: Secondary | ICD-10-CM | POA: Diagnosis not present

## 2020-08-22 LAB — CBC WITH DIFFERENTIAL/PLATELET
Abs Immature Granulocytes: 0.07 10*3/uL (ref 0.00–0.07)
Basophils Absolute: 0 10*3/uL (ref 0.0–0.1)
Basophils Relative: 1 %
Eosinophils Absolute: 0 10*3/uL (ref 0.0–0.5)
Eosinophils Relative: 0 %
HCT: 45.9 % (ref 39.0–52.0)
Hemoglobin: 15.5 g/dL (ref 13.0–17.0)
Immature Granulocytes: 1 %
Lymphocytes Relative: 29 %
Lymphs Abs: 2.1 10*3/uL (ref 0.7–4.0)
MCH: 29.9 pg (ref 26.0–34.0)
MCHC: 33.8 g/dL (ref 30.0–36.0)
MCV: 88.4 fL (ref 80.0–100.0)
Monocytes Absolute: 0.9 10*3/uL (ref 0.1–1.0)
Monocytes Relative: 13 %
Neutro Abs: 4 10*3/uL (ref 1.7–7.7)
Neutrophils Relative %: 56 %
Platelets: 224 10*3/uL (ref 150–400)
RBC: 5.19 MIL/uL (ref 4.22–5.81)
RDW: 13 % (ref 11.5–15.5)
WBC: 7.2 10*3/uL (ref 4.0–10.5)
nRBC: 0 % (ref 0.0–0.2)

## 2020-08-22 LAB — PROCALCITONIN: Procalcitonin: <0.1 ng/mL

## 2020-08-22 LAB — TROPONIN I (HIGH SENSITIVITY): Troponin I (High Sensitivity): 6 ng/L (ref <18)

## 2020-08-22 LAB — COMPREHENSIVE METABOLIC PANEL
ALT: 70 U/L — ABNORMAL HIGH (ref 0–44)
AST: 51 U/L — ABNORMAL HIGH (ref 15–41)
Albumin: 3.8 g/dL (ref 3.5–5.0)
Alkaline Phosphatase: 46 U/L (ref 38–126)
Anion gap: 10 (ref 5–15)
BUN: 15 mg/dL (ref 6–20)
CO2: 27 mmol/L (ref 22–32)
Calcium: 9 mg/dL (ref 8.9–10.3)
Chloride: 104 mmol/L (ref 98–111)
Creatinine, Ser: 0.83 mg/dL (ref 0.61–1.24)
GFR, Estimated: >60 mL/min (ref >60)
Glucose, Bld: 113 mg/dL — ABNORMAL HIGH (ref 70–99)
Potassium: 3.6 mmol/L (ref 3.5–5.1)
Sodium: 141 mmol/L (ref 135–145)
Total Bilirubin: 0.7 mg/dL (ref 0.3–1.2)
Total Protein: 8 g/dL (ref 6.5–8.1)

## 2020-08-22 MED ORDER — AZITHROMYCIN 250 MG PO TABS: ORAL_TABLET | ORAL | 0 refills | Status: AC

## 2020-08-22 MED ORDER — IOHEXOL 350 MG/ML SOLN
100.0000 mL | Freq: Once | INTRAVENOUS | Status: AC | PRN
  Administered 2020-08-22: 100 mL via INTRAVENOUS
  Filled 2020-08-22: qty 100

## 2020-08-22 NOTE — ED Provider Notes (Signed)
[facility] Emergency Department Provider Note  ____________________________________________   Event Date/Time   First MD Initiated Contact with Patient 08/22/20 1212     (approximate)  I have reviewed the triage vital signs and the nursing notes.   HISTORY  Chief Complaint Shortness of Breath    HPI Geoffrey Dalton is a 30 y.o. male who was COVID-positive on 12/26 who comes in with shortness of breath.  Patient states that he has been on albuterol and prednisone after being seen on 1/3. Patient states that he continues to have moderate shortness of breath, worse with exertion, better at rest. Shortness of breath is also worse at nighttime. Denies any swelling in his legs although reported some pain in his left upper leg that is now resolved. Patient also reports hemoptysis that recently started. Small amounts of blood-tinged sputum. He denies any abdominal pain, history of PE.          Past Medical History:  Diagnosis Date  . Hashimoto's thyroiditis     Patient Active Problem List   Diagnosis Date Noted  . Hyperthyroidism 04/11/2018    History reviewed. No pertinent surgical history.  Prior to Admission medications   Medication Sig Start Date End Date Taking? Authorizing Provider  albuterol (PROVENTIL HFA) 108 (90 Base) MCG/ACT inhaler Inhale 2 puffs into the lungs every 4 (four) hours as needed for wheezing or shortness of breath. 08/18/20   Sharman Cheek, MD  amphetamine-dextroamphetamine (ADDERALL) 30 MG tablet daily. 02/12/18   [provider]  famotidine (PEPCID) 20 MG tablet Take 1 tablet (20 mg total) by mouth 2 (two) times daily. 08/18/20   Sharman Cheek, MD  ondansetron (ZOFRAN ODT) 4 MG disintegrating tablet Take 1 tablet (4 mg total) by mouth every 8 (eight) hours as needed for nausea or vomiting. 08/18/20   Sharman Cheek, MD  predniSONE (DELTASONE) 20 MG tablet Take 2 tablets (40 mg total) by mouth daily. 08/18/20    Sharman Cheek, MD  propranolol (INDERAL) 20 MG tablet Take 1 tablet (20 mg total) by mouth 2 (two) times daily. 04/11/18   Roma Kayser, MD  sertraline (ZOLOFT) 100 MG tablet daily. 03/13/18   [provider]    Allergies Oxycodone-acetaminophen and Morphine and related  No family history on file.  Social History Social History   Tobacco Use  . Smoking status: Former Smoker    Packs/day: 1.00    Years: 9.00    Pack years: 9.00    Types: Cigarettes    Quit date: 10/12/2012    Years since quitting: 7.8  . Smokeless tobacco: Never Used  Vaping Use  . Vaping Use: Never used  Substance Use Topics  . Alcohol use: Not Currently  . Drug use: Never      Review of Systems Constitutional: No fever/chills Eyes: No visual changes. ENT: No sore throat. Cardiovascular: No chest pain Respiratory: Positive for SOB, hemoptysis Gastrointestinal: No abdominal pain.  No nausea, no vomiting.  No diarrhea.  No constipation. Genitourinary: Negative for dysuria. Musculoskeletal: Negative for back pain. Skin: Negative for rash. Neurological: Negative for headaches, focal weakness or numbness. All other ROS negative ____________________________________________   PHYSICAL EXAM:  VITAL SIGNS: ED Triage Vitals  Enc Vitals Group     BP 08/22/20 1143 (!) 133/100     Pulse Rate 08/22/20 1143 87     Resp 08/22/20 1143 18     Temp 08/22/20 1143 97.9 F (36.6 C)     Temp src --  SpO2 08/22/20 1143 95 %     Weight --      Height --      Head Circumference --      Peak Flow --      Pain Score 08/22/20 1151 0     Pain Loc --      Pain Edu? --      Excl. in [place]? --     Constitutional: Alert and oriented. Well appearing and in no acute distress. Eyes: Conjunctivae are normal. EOMI. Head: Atraumatic. Nose: No congestion/rhinnorhea. Mouth/Throat: Mucous membranes are moist.   Neck: No stridor. Trachea Midline. FROM Cardiovascular: Normal rate, regular rhythm.  Grossly normal heart sounds.  Good peripheral circulation. Respiratory: Clear lungs bilaterally, mild increased work of breathing with ambulation but saturations remained above 95% Gastrointestinal: Soft and nontender. No distention. No abdominal bruits.  Musculoskeletal: No lower extremity tenderness nor edema.  No joint effusions.  No swelling noted to the legs.  No pain. Neurologic:  Normal speech and language. No gross focal neurologic deficits are appreciated.  Skin:  Skin is warm, dry and intact. No rash noted. Psychiatric: Mood and affect are normal. Speech and behavior are normal. GU: Deferred   ____________________________________________   LABS (all labs ordered are listed, but only abnormal results are displayed)  Labs Reviewed  COMPREHENSIVE METABOLIC PANEL - Abnormal; Notable for the following components:      Result Value   Glucose, Bld 113 (*)    AST 51 (*)    ALT 70 (*)    All other components within normal limits  CBC WITH DIFFERENTIAL/PLATELET  PROCALCITONIN  TROPONIN I (HIGH SENSITIVITY)   ____________________________________________   ED ECG REPORT I, Vanessa [place], the attending physician, personally viewed and interpreted this ECG.  EKG is normal sinus rate of 90, no ST elevation, S1Q3T3 with incomplete right bundle branch block ____________________________________________  RADIOLOGY I, Vanessa [place], personally viewed and evaluated these images (plain radiographs) as part of my medical decision making, as well as reviewing the written report by the radiologist.  ED MD interpretation: Bilateral opacifications consistent with his [place]  Official radiology report(s): DG Chest 2 View  Result Date: 08/22/2020 CLINICAL DATA:  Shortness of breath. Positive COVID-19 test on 08/10/2020. EXAM: CHEST - 2 VIEW COMPARISON:  08/18/2020 FINDINGS: Normal sized heart. Interval mild multifocal, irregular, ill-defined nodular opacities in both lungs, most pronounced in the  periphery. No pleural fluid. Unremarkable bones. IMPRESSION: Interval mild bilateral lung opacities suggesting mild COVID pneumonitis. Electronically Signed   By: Claudie Revering M.D.   On: 08/22/2020 12:20    ____________________________________________   PROCEDURES  Procedure(s) performed (including Critical Care):  Procedures   ____________________________________________   INITIAL IMPRESSION / ASSESSMENT AND PLAN / ED COURSE   Geoffrey Dalton was evaluated in Emergency Department on 08/22/2020 for the symptoms described in the history of present illness. He was evaluated in the context of the global COVID-19 pandemic, which necessitated consideration that the patient might be at risk for infection with the SARS-CoV-2 virus that causes COVID-19. Institutional protocols and algorithms that pertain to the evaluation of patients at risk for COVID-19 are in a state of rapid change based on information released by regulatory bodies including the CDC and federal and state organizations. These policies and algorithms were followed during the patient's care in the ED.     Pt presents with SOB status post COVID. Patient has greater than 10 days out now with new hemoptysis and S1Q3T3 on EKG. Given this  we will proceed with CT PE to rule out PE. If negative suspect that this is just his COVID resolving and continue to take more time. Patient expressed understanding  PNA-will get xray to evaluation Anemia-CBC to evaluate ACS- will get trops Arrhythmia-Will get EKG and keep on monitor.   I ambulate patient around the room sats above 95%.  Troponin is negative.  Labs are reassuring.  CT scan shows COVID-19 but no evidence of PE.  Given patient has continued symptoms we can trial some azithromycin and see if that helps patient.   Discussed with patient he felt comfortable with this plan.  Patient will be discharged home at this time  I discussed the provisional nature of ED diagnosis, the treatment so  far, the ongoing plan of care, follow up appointments and return precautions with the patient and any family or support people present. They expressed understanding and agreed with the plan, discharged home.   ____________________________________________   FINAL CLINICAL IMPRESSION(S) / ED DIAGNOSES   Final diagnoses:  Pneumonia due to COVID-19 virus     MEDICATIONS GIVEN DURING THIS VISIT:  Medications  iohexol (OMNIPAQUE) 350 MG/ML injection 100 mL (100 mLs Intravenous Contrast Given 08/22/20 1300)     ED Discharge Orders         Ordered    azithromycin (ZITHROMAX Z-PAK) 250 MG tablet        08/22/20 1339           Note:  This document was prepared using Dragon voice recognition software and may include unintentional dictation errors.   Concha Se, MD 08/22/20 [phone]

## 2020-08-22 NOTE — Discharge Instructions (Addendum)
Your procalcitonin it looks to see if there is a bacterial infection is still pending.  We will start you on some antibiotics that help with both bacterial infections as well as inflammation.  Otherwise it may take weeks for your shortness of breath to resolve from your COVID.  Need to follow-up with your primary care doctor.  Return to the ER if you develop worsening symptoms  IMPRESSION: 1. No evidence of a pulmonary embolism. 2. Bilateral small patchy and ill-defined nodular ground-glass opacities throughout both lungs consistent with multifocal pneumonia, pattern compatible with COVID-19 infection or other atypical pneumonia.

## 2020-08-22 NOTE — ED Notes (Signed)
NAD noted at time of D/C. Pt denies questions or concerns. Pt ambulatory to the lobby at this time.  

## 2020-08-22 NOTE — ED Triage Notes (Signed)
Pt to ED via POV stating that he is having shortness of breath. Pt states that he tested positive at home for COVID on 12/26. Pt states that he was albuterol and prednisone. Pt has finished the prednisone. Pt states that he does not think that the albuterol is helping with the shortness of breath. Pt states that he is coughing up bloody sputum. Pt appears short of breath. Pt is in NAD.

## 2022-11-16 ENCOUNTER — Ambulatory Visit: Payer: Self-pay | Admitting: Surgery
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
## 2022-11-23 ENCOUNTER — Other Ambulatory Visit: Payer: Self-pay

## 2022-11-23 ENCOUNTER — Encounter
Admission: RE | Admit: 2022-11-23 | Discharge: 2022-11-23 | Disposition: A | Discharge status: Home / Self Care | Payer: Managed Care, Other (non HMO) | Source: Ambulatory Visit | Attending: Surgery | Admitting: Surgery

## 2022-11-23 DIAGNOSIS — Z01812 Encounter for preprocedural laboratory examination: Secondary | ICD-10-CM

## 2022-11-23 HISTORY — DX: Personal history of urinary calculi: Z87.442

## 2022-11-23 HISTORY — DX: Essential (primary) hypertension: I10

## 2022-11-23 HISTORY — DX: Pilonidal cyst without abscess: L05.91

## 2022-11-23 HISTORY — DX: Gout, unspecified: M10.9

## 2022-11-23 HISTORY — DX: Pneumonia, unspecified organism: J18.9

## 2022-11-23 HISTORY — DX: Headache, unspecified: R51.9

## 2022-11-23 HISTORY — DX: Anxiety disorder, unspecified: F41.9

## 2022-11-23 NOTE — Patient Instructions (Addendum)
Your procedure is scheduled on: 12/02/22 - Thursday Report to the Registration Desk on the 1st floor of the [facility]. To find out your arrival time, please call [phone] between 1PM - 3PM on: 12/01/22 - Wednesday If your arrival time is 6:00 am, do not arrive before that time as the [facility] entrance doors do not open until 6:00 am.  REMEMBER: Instructions that are not followed completely may result in serious medical risk, up to and including death; or upon the discretion of your surgeon and anesthesiologist your surgery may need to be rescheduled.  Do not eat food after midnight the night before surgery.  No gum chewing or hard candies.  You may however, drink CLEAR liquids up to 2 hours before you are scheduled to arrive for your surgery. Do not drink anything within 2 hours of your scheduled arrival time.  Clear liquids include: - water  - apple juice without pulp - gatorade (not RED colors) - black coffee or tea (Do NOT add milk or creamers to the coffee or tea) Do NOT drink anything that is not on this list.  One week prior to surgery: Stop beginning, 11/25/22 Anti-inflammatories (NSAIDS) such as Advil, Aleve, Ibuprofen, Motrin, Naproxen, Naprosyn and Aspirin based products such as Excedrin, Goody's Powder, BC Powder . Stop ANY OVER THE COUNTER supplements until after surgery.  TAKE ONLY THESE MEDICATIONS THE MORNING OF SURGERY WITH A SIP OF WATER:  sertraline (ZOLOFT)  allopurinol (ZYLOPRIM)    No Alcohol for 24 hours before or after surgery.  No Smoking including e-cigarettes for 24 hours before surgery.  No chewable tobacco products for at least 6 hours before surgery.  No nicotine patches on the day of surgery.  Do not use any "recreational" drugs for at least a week (preferably 2 weeks) before your surgery.  Please be advised that the combination of cocaine and anesthesia may have negative outcomes, up to and including death. If you test positive for  cocaine, your surgery will be cancelled.  On the morning of surgery brush your teeth with toothpaste and water, you may rinse your mouth with mouthwash if you wish. Do not swallow any toothpaste or mouthwash.  Use CHG Soap or wipes as directed on instruction sheet.  Do not wear jewelry, make-up, hairpins, clips or nail polish.  Do not wear lotions, powders, or perfumes.   Do not shave body hair from the neck down 48 hours before surgery.  Contact lenses, hearing aids and dentures may not be worn into surgery.  Do not bring valuables to the hospital. [facility] is not responsible for any missing/lost belongings or valuables.   Notify your doctor if there is any change in your medical condition (cold, fever, infection).  Wear comfortable clothing (specific to your surgery type) to the hospital.  After surgery, you can help prevent lung complications by doing breathing exercises.  Take deep breaths and cough every 1-2 hours. Your doctor may order a device called an Incentive Spirometer to help you take deep breaths. When coughing or sneezing, hold a pillow firmly against your incision with both hands. This is called "splinting." Doing this helps protect your incision. It also decreases belly discomfort.  If you are being admitted to the hospital overnight, leave your suitcase in the car. After surgery it may be brought to your room.  In case of increased patient census, it may be necessary for you, the patient, to continue your postoperative care in the Same Day Surgery department.  If you are being discharged the day of surgery, you will not be allowed to drive home. You will need a responsible individual to drive you home and stay with you for 24 hours after surgery.   If you are taking public transportation, you will need to have a responsible individual with you.  Please call the Pre-admissions Testing Dept. at [phone] if you have any questions about these  instructions.  Surgery Visitation Policy:  Patients having surgery or a procedure may have two visitors.  Children under the age of 50 must have an adult with them who is not the patient.  Inpatient Visitation:    Visiting hours are 7 a.m. to 8 p.m. Up to four visitors are allowed at one time in a patient room. The visitors may rotate out with other people during the day.  One visitor age 68 or older may stay with the patient overnight and must be in the room by 8 p.m.    Preparing for Surgery with CHLORHEXIDINE GLUCONATE (CHG) Soap  Chlorhexidine Gluconate (CHG) Soap  o An antiseptic cleaner that kills germs and bonds with the skin to continue killing germs even after washing  o Used for showering the night before surgery and morning of surgery  Before surgery, you can play an important role by reducing the number of germs on your skin.  CHG (Chlorhexidine gluconate) soap is an antiseptic cleanser which kills germs and bonds with the skin to continue killing germs even after washing.  Please do not use if you have an allergy to CHG or antibacterial soaps. If your skin becomes reddened/irritated stop using the CHG.  1. Shower the NIGHT BEFORE SURGERY and the MORNING OF SURGERY with CHG soap.  2. If you choose to wash your hair, wash your hair first as usual with your normal shampoo.  3. After shampooing, rinse your hair and body thoroughly to remove the shampoo.  4. Use CHG as you would any other liquid soap. You can apply CHG directly to the skin and wash gently with a scrungie or a clean washcloth.  5. Apply the CHG soap to your body only from the neck down. Do not use on open wounds or open sores. Avoid contact with your eyes, ears, mouth, and genitals (private parts). Wash face and genitals (private parts) with your normal soap.  6. Wash thoroughly, paying special attention to the area where your surgery will be performed.  7. Thoroughly rinse your body with warm  water.  8. Do not shower/wash with your normal soap after using and rinsing off the CHG soap.  9. Pat yourself dry with a clean towel.  10. Wear clean pajamas to bed the night before surgery.  12. Place clean sheets on your bed the night of your first shower and do not sleep with pets.  13. Shower again with the CHG soap on the day of surgery prior to arriving at the hospital.  14. Do not apply any deodorants/lotions/powders.  15. Please wear clean clothes to the hospital.

## 2022-11-24 ENCOUNTER — Encounter
Admission: RE | Admit: 2022-11-24 | Discharge: 2022-11-24 | Disposition: A | Discharge status: Home / Self Care | Payer: Managed Care, Other (non HMO) | Source: Ambulatory Visit | Attending: Surgery | Admitting: Surgery

## 2022-11-24 DIAGNOSIS — Z01810 Encounter for preprocedural cardiovascular examination: Secondary | ICD-10-CM | POA: Diagnosis present

## 2022-11-24 DIAGNOSIS — Z01812 Encounter for preprocedural laboratory examination: Secondary | ICD-10-CM

## 2022-12-02 ENCOUNTER — Encounter: Payer: Self-pay | Admitting: Surgery

## 2022-12-02 ENCOUNTER — Ambulatory Visit: Payer: Managed Care, Other (non HMO) | Admitting: Urgent Care

## 2022-12-02 ENCOUNTER — Other Ambulatory Visit: Payer: Self-pay

## 2022-12-02 ENCOUNTER — Encounter
Admission: RE | Disposition: A | Discharge status: Home / Self Care | Payer: Self-pay | Source: Home / Self Care | Attending: Surgery

## 2022-12-02 ENCOUNTER — Ambulatory Visit: Payer: Managed Care, Other (non HMO) | Admitting: Anesthesiology

## 2022-12-02 ENCOUNTER — Ambulatory Visit
Admission: RE | Admit: 2022-12-02 | Discharge: 2022-12-02 | Disposition: A | Discharge status: Home / Self Care | Payer: Managed Care, Other (non HMO) | Attending: Surgery | Admitting: Surgery

## 2022-12-02 DIAGNOSIS — M109 Gout, unspecified: Secondary | ICD-10-CM | POA: Diagnosis not present

## 2022-12-02 DIAGNOSIS — Z87891 Personal history of nicotine dependence: Secondary | ICD-10-CM | POA: Insufficient documentation

## 2022-12-02 DIAGNOSIS — L0501 Pilonidal cyst with abscess: Secondary | ICD-10-CM | POA: Diagnosis not present

## 2022-12-02 DIAGNOSIS — I10 Essential (primary) hypertension: Secondary | ICD-10-CM | POA: Diagnosis not present

## 2022-12-02 DIAGNOSIS — Z6841 Body Mass Index (BMI) 40.0 and over, adult: Secondary | ICD-10-CM | POA: Insufficient documentation

## 2022-12-02 DIAGNOSIS — F419 Anxiety disorder, unspecified: Secondary | ICD-10-CM | POA: Diagnosis not present

## 2022-12-02 DIAGNOSIS — L0591 Pilonidal cyst without abscess: Secondary | ICD-10-CM | POA: Diagnosis present

## 2022-12-02 HISTORY — PX: PILONIDAL CYST EXCISION: SHX744

## 2022-12-02 SURGERY — EXCISION, PILONIDAL CYST, EXTENSIVE
Anesthesia: General | Site: Buttocks

## 2022-12-02 MED ORDER — LACTATED RINGERS IV SOLN: INTRAVENOUS | Status: DC

## 2022-12-02 MED ORDER — PROPOFOL 10 MG/ML IV BOLUS
INTRAVENOUS | Status: DC | PRN
  Administered 2022-12-02: 200 mg via INTRAVENOUS
  Administered 2022-12-02: 70 mg via INTRAVENOUS

## 2022-12-02 MED ORDER — FAMOTIDINE 20 MG PO TABS
20.0000 mg | ORAL_TABLET | Freq: Once | ORAL | Status: AC
  Administered 2022-12-02: 20 mg via ORAL

## 2022-12-02 MED ORDER — FAMOTIDINE 20 MG PO TABS
ORAL_TABLET | ORAL | Status: AC
  Filled 2022-12-02: qty 1

## 2022-12-02 MED ORDER — FENTANYL CITRATE (PF) 100 MCG/2ML IJ SOLN
INTRAMUSCULAR | Status: DC | PRN
  Administered 2022-12-02 (×2): 50 ug via INTRAVENOUS

## 2022-12-02 MED ORDER — TRAMADOL HCL 50 MG PO TABS: 50.0000 mg | ORAL_TABLET | Freq: Four times a day (QID) | ORAL | 0 refills | Status: DC | PRN

## 2022-12-02 MED ORDER — DEXAMETHASONE SODIUM PHOSPHATE 10 MG/ML IJ SOLN
INTRAMUSCULAR | Status: DC | PRN
  Administered 2022-12-02: 10 mg via INTRAVENOUS

## 2022-12-02 MED ORDER — ONDANSETRON HCL 4 MG/2ML IJ SOLN
INTRAMUSCULAR | Status: AC
  Filled 2022-12-02: qty 2

## 2022-12-02 MED ORDER — KETOROLAC TROMETHAMINE 30 MG/ML IJ SOLN: INTRAMUSCULAR | Status: DC | PRN

## 2022-12-02 MED ORDER — CEFAZOLIN SODIUM-DEXTROSE 1-4 GM/50ML-% IV SOLN: INTRAVENOUS | Status: DC | PRN

## 2022-12-02 MED ORDER — ONDANSETRON HCL 4 MG/2ML IJ SOLN
4.0000 mg | Freq: Once | INTRAMUSCULAR | Status: AC | PRN
  Administered 2022-12-02: 4 mg via INTRAVENOUS

## 2022-12-02 MED ORDER — CHLORHEXIDINE GLUCONATE CLOTH 2 % EX PADS
6.0000 | MEDICATED_PAD | Freq: Once | CUTANEOUS | Status: AC
  Administered 2022-12-02: 6 via TOPICAL

## 2022-12-02 MED ORDER — BUPIVACAINE HCL (PF) 0.5 % IJ SOLN
INTRAMUSCULAR | Status: AC
  Filled 2022-12-02: qty 30

## 2022-12-02 MED ORDER — CHLORHEXIDINE GLUCONATE 0.12 % MT SOLN
OROMUCOSAL | Status: AC
  Filled 2022-12-02: qty 15

## 2022-12-02 MED ORDER — CEFAZOLIN SODIUM-DEXTROSE 2-4 GM/100ML-% IV SOLN
INTRAVENOUS | Status: AC
  Filled 2022-12-02: qty 100

## 2022-12-02 MED ORDER — ACETAMINOPHEN 10 MG/ML IV SOLN: 1000.0000 mg | Freq: Once | INTRAVENOUS | Status: DC | PRN

## 2022-12-02 MED ORDER — ROCURONIUM BROMIDE 10 MG/ML (PF) SYRINGE
PREFILLED_SYRINGE | INTRAVENOUS | Status: AC
  Filled 2022-12-02: qty 10

## 2022-12-02 MED ORDER — PROPOFOL 10 MG/ML IV BOLUS
INTRAVENOUS | Status: AC
  Filled 2022-12-02: qty 40

## 2022-12-02 MED ORDER — OXYCODONE HCL 5 MG/5ML PO SOLN
5.0000 mg | Freq: Once | ORAL | Status: DC | PRN
Start: 2022-12-02 — End: 2022-12-02

## 2022-12-02 MED ORDER — ACETAMINOPHEN 10 MG/ML IV SOLN
INTRAVENOUS | Status: AC
  Filled 2022-12-02: qty 100

## 2022-12-02 MED ORDER — TRAMADOL HCL 50 MG PO TABS
ORAL_TABLET | ORAL | Status: AC
  Filled 2022-12-02: qty 1

## 2022-12-02 MED ORDER — FENTANYL CITRATE (PF) 100 MCG/2ML IJ SOLN
INTRAMUSCULAR | Status: AC
  Filled 2022-12-02: qty 2

## 2022-12-02 MED ORDER — SUGAMMADEX SODIUM 200 MG/2ML IV SOLN
INTRAVENOUS | Status: DC | PRN
  Administered 2022-12-02: 400 mg via INTRAVENOUS

## 2022-12-02 MED ORDER — BUPIVACAINE LIPOSOME 1.3 % IJ SUSP
INTRAMUSCULAR | Status: AC
  Filled 2022-12-02: qty 20

## 2022-12-02 MED ORDER — 0.9 % SODIUM CHLORIDE (POUR BTL) OPTIME
TOPICAL | Status: DC | PRN
  Administered 2022-12-02: 500 mL

## 2022-12-02 MED ORDER — LIDOCAINE HCL (CARDIAC) PF 100 MG/5ML IV SOSY
PREFILLED_SYRINGE | INTRAVENOUS | Status: DC | PRN
  Administered 2022-12-02: 100 mg via INTRAVENOUS

## 2022-12-02 MED ORDER — ONDANSETRON HCL 4 MG/2ML IJ SOLN
INTRAMUSCULAR | Status: DC | PRN
  Administered 2022-12-02: 4 mg via INTRAVENOUS

## 2022-12-02 MED ORDER — MIDAZOLAM HCL 2 MG/2ML IJ SOLN
INTRAMUSCULAR | Status: AC
  Filled 2022-12-02: qty 2

## 2022-12-02 MED ORDER — DEXAMETHASONE SODIUM PHOSPHATE 10 MG/ML IJ SOLN
INTRAMUSCULAR | Status: AC
  Filled 2022-12-02: qty 1

## 2022-12-02 MED ORDER — MIDAZOLAM HCL 2 MG/2ML IJ SOLN
INTRAMUSCULAR | Status: DC | PRN
  Administered 2022-12-02: 2 mg via INTRAVENOUS

## 2022-12-02 MED ORDER — CHLORHEXIDINE GLUCONATE 0.12 % MT SOLN
15.0000 mL | Freq: Once | OROMUCOSAL | Status: AC
  Administered 2022-12-02: 15 mL via OROMUCOSAL

## 2022-12-02 MED ORDER — PHENYLEPHRINE HCL (PRESSORS) 10 MG/ML IV SOLN
INTRAVENOUS | Status: DC | PRN
  Administered 2022-12-02 (×2): 160 ug via INTRAVENOUS

## 2022-12-02 MED ORDER — BUPIVACAINE HCL (PF) 0.5 % IJ SOLN
INTRAMUSCULAR | Status: DC | PRN
  Administered 2022-12-02: 9 mL

## 2022-12-02 MED ORDER — OXYCODONE HCL 5 MG PO TABS
5.0000 mg | ORAL_TABLET | Freq: Once | ORAL | Status: DC | PRN
Start: 2022-12-02 — End: 2022-12-02

## 2022-12-02 MED ORDER — CEFAZOLIN SODIUM-DEXTROSE 2-4 GM/100ML-% IV SOLN
2.0000 g | INTRAVENOUS | Status: AC
  Administered 2022-12-02: 2 g via INTRAVENOUS

## 2022-12-02 MED ORDER — ACETAMINOPHEN 10 MG/ML IV SOLN
INTRAVENOUS | Status: DC | PRN
  Administered 2022-12-02: 1000 mg via INTRAVENOUS

## 2022-12-02 MED ORDER — ACETAMINOPHEN 325 MG PO TABS: 650.0000 mg | ORAL_TABLET | Freq: Three times a day (TID) | ORAL | 0 refills | Status: AC | PRN

## 2022-12-02 MED ORDER — LIDOCAINE HCL (PF) 2 % IJ SOLN
INTRAMUSCULAR | Status: AC
  Filled 2022-12-02: qty 5

## 2022-12-02 MED ORDER — IBUPROFEN 800 MG PO TABS: 800.0000 mg | ORAL_TABLET | Freq: Three times a day (TID) | ORAL | 0 refills | Status: AC | PRN

## 2022-12-02 MED ORDER — TRAMADOL HCL 50 MG PO TABS
50.0000 mg | ORAL_TABLET | Freq: Once | ORAL | Status: AC | PRN
  Administered 2022-12-02: 50 mg via ORAL

## 2022-12-02 MED ORDER — ORAL CARE MOUTH RINSE: 15.0000 mL | Freq: Once | OROMUCOSAL | Status: AC

## 2022-12-02 MED ORDER — FENTANYL CITRATE (PF) 100 MCG/2ML IJ SOLN
25.0000 ug | INTRAMUSCULAR | Status: DC | PRN
  Administered 2022-12-02 (×3): 25 ug via INTRAVENOUS

## 2022-12-02 MED ORDER — BUPIVACAINE-EPINEPHRINE 0.5% -1:200000 IJ SOLN: INTRAMUSCULAR | Status: DC | PRN

## 2022-12-02 MED ORDER — ROCURONIUM BROMIDE 100 MG/10ML IV SOLN
INTRAVENOUS | Status: DC | PRN
  Administered 2022-12-02: 60 mg via INTRAVENOUS

## 2022-12-02 MED ORDER — DOCUSATE SODIUM 100 MG PO CAPS: 100.0000 mg | ORAL_CAPSULE | Freq: Two times a day (BID) | ORAL | 0 refills | Status: AC | PRN

## 2022-12-02 MED ORDER — CEFAZOLIN SODIUM-DEXTROSE 1-4 GM/50ML-% IV SOLN
INTRAVENOUS | Status: DC | PRN
  Administered 2022-12-02: 1 g via INTRAVENOUS

## 2022-12-02 SURGICAL SUPPLY — 34 items
ADH SKN CLS APL DERMABOND .7 (GAUZE/BANDAGES/DRESSINGS) ×1
BLADE CLIPPER SURG (BLADE) ×1 IMPLANT
BLADE SURG SZ10 CARB STEEL (BLADE) ×1 IMPLANT
BRIEF MESH DISP 2XL (UNDERPADS AND DIAPERS) ×1 IMPLANT
DERMABOND ADVANCED .7 DNX12 (GAUZE/BANDAGES/DRESSINGS) IMPLANT
DRAPE LAPAROTOMY 100X77 ABD (DRAPES) ×1 IMPLANT
ELECT REM PT RETURN 9FT ADLT (ELECTROSURGICAL) ×1
ELECTRODE REM PT RTRN 9FT ADLT (ELECTROSURGICAL) ×1 IMPLANT
GAUZE SPONGE 4X4 12PLY STRL (GAUZE/BANDAGES/DRESSINGS) ×1 IMPLANT
GLOVE BIOGEL PI IND STRL 7.0 (GLOVE) ×1 IMPLANT
GLOVE SURG SYN 6.5 ES PF (GLOVE) ×1 IMPLANT
GLOVE SURG SYN 6.5 PF PI (GLOVE) ×1 IMPLANT
GOWN STRL REUS W/ TWL LRG LVL3 (GOWN DISPOSABLE) ×2 IMPLANT
GOWN STRL REUS W/TWL LRG LVL3 (GOWN DISPOSABLE) ×2
KIT TURNOVER KIT A (KITS) ×1 IMPLANT
MANIFOLD NEPTUNE II (INSTRUMENTS) ×1 IMPLANT
NDL HYPO 22X1.5 SAFETY MO (MISCELLANEOUS) ×1 IMPLANT
NEEDLE HYPO 22X1.5 SAFETY MO (MISCELLANEOUS) ×1 IMPLANT
NS IRRIG 500ML POUR BTL (IV SOLUTION) ×1 IMPLANT
PACK BASIN MINOR ARMC (MISCELLANEOUS) ×1 IMPLANT
PAD ABD DERMACEA PRESS 5X9 (GAUZE/BANDAGES/DRESSINGS) ×1 IMPLANT
SOL PREP PVP 2OZ (MISCELLANEOUS) ×1
SOLUTION PREP PVP 2OZ (MISCELLANEOUS) ×1 IMPLANT
SUT MNCRL 4-0 (SUTURE) ×1
SUT MNCRL 4-0 27XMFL (SUTURE) ×1
SUT VIC AB 3-0 SH 27 (SUTURE) ×1
SUT VIC AB 3-0 SH 27X BRD (SUTURE) ×1 IMPLANT
SUTURE MNCRL 4-0 27XMF (SUTURE) ×1 IMPLANT
SYR 10ML LL (SYRINGE) ×1 IMPLANT
SYR 20ML LL LF (SYRINGE) ×1 IMPLANT
SYR BULB IRRIG 60ML STRL (SYRINGE) ×1 IMPLANT
TAPE CLOTH 3X10 WHT NS LF (GAUZE/BANDAGES/DRESSINGS) ×1 IMPLANT
TRAP FLUID SMOKE EVACUATOR (MISCELLANEOUS) ×1 IMPLANT
WATER STERILE IRR 500ML POUR (IV SOLUTION) ×1 IMPLANT

## 2022-12-02 NOTE — Anesthesia Preprocedure Evaluation (Addendum)
Anesthesia Evaluation  Patient identified by MRN, date of birth, ID band Patient awake    Reviewed: Allergy & Precautions, NPO status , Patient's Chart, lab work & pertinent test results  History of Anesthesia Complications Negative for: history of anesthetic complications  Airway Mallampati: I   Neck ROM: Full    Dental no notable dental hx.    Pulmonary former smoker (quit 2014)   Pulmonary exam normal breath sounds clear to auscultation       Cardiovascular hypertension, Normal cardiovascular exam Rhythm:Regular Rate:Normal  ECG 11/24/22:  Normal sinus rhythm Non-specific intra-ventricular conduction delay Borderline ECG When compared with ECG of 22-Aug-2020 11:55, No significant change was found   Neuro/Psych  Headaches PSYCHIATRIC DISORDERS Anxiety        GI/Hepatic negative GI ROS,,,  Endo/Other  Class 3 obesity  Renal/GU Renal disease (nephrolithiasis)     Musculoskeletal Gout    Abdominal   Peds  Hematology negative hematology ROS (+)   Anesthesia Other Findings   Reproductive/Obstetrics                             Anesthesia Physical Anesthesia Plan  ASA: 3  Anesthesia Plan: General   Post-op Pain Management:    Induction: Intravenous  PONV Risk Score and Plan: 2 and Ondansetron, Dexamethasone and Treatment may vary due to age or medical condition  Airway Management Planned: Oral ETT  Additional Equipment:   Intra-op Plan:   Post-operative Plan: Extubation in OR  Informed Consent: I have reviewed the patients History and Physical, chart, labs and discussed the procedure including the risks, benefits and alternatives for the proposed anesthesia with the patient or authorized representative who has indicated his/her understanding and acceptance.     Dental advisory given  Plan Discussed with: CRNA  Anesthesia Plan Comments: (Patient consented for risks of  anesthesia including but not limited to:  - adverse reactions to medications - damage to eyes, teeth, lips or other oral mucosa - nerve damage due to positioning  - sore throat or hoarseness - damage to heart, brain, nerves, lungs, other parts of body or loss of life  Informed patient about role of CRNA in peri- and intra-operative care.  Patient voiced understanding.)        Anesthesia Quick Evaluation

## 2022-12-02 NOTE — Interval H&P Note (Signed)
No change. OK to proceed.

## 2022-12-02 NOTE — Op Note (Signed)
Pre-Op Dx: pilonidal cyst Post-Op Dx: same Anesthesia: GETA EBL: 10ml Complications:  none apparent Specimen: pilonidat cyst Procedure: pilonidal cystectomy Surgeon: Tonna Boehringer  Indications for procedure: See H&P  Description of Procedure:  Consent obtained, time out performed.  Patient placed in prone position.  Area sterilized and draped in usual position.  Local infused to area previously marked.  5cm elliptical incision made around chronic cyst dermis with 15blade The  3cm x 5cm x 2.3cm area then removed from surrounding tissue completely using electrocautery, passed off field pending pathology.  Small amount of hair under skin removed, all unhealthy tissue removed as well. No tunneling of additional sinus tract noted.   Wound hemostasis noted, then closed in two layer fashion with 3-0 vicryl in interrupted fashion for deep dermal layer, then running 4-0 monocryl in subcuticular fashion for epidermal layer.  Wound then dressed with dermabond.  Pt tolerated procedure well, and transferred to PACU in stable condition. Sponge and instrument count correct at end of procedure.

## 2022-12-02 NOTE — Anesthesia Procedure Notes (Signed)
Procedure Name: Intubation Date/Time: 12/02/2022 2:01 PM  Performed by: Ginger Carne, CRNAPre-anesthesia Checklist: Patient identified, Emergency Drugs available, Suction available, Patient being monitored and Timeout performed Patient Re-evaluated:Patient Re-evaluated prior to induction Oxygen Delivery Method: Circle system utilized Preoxygenation: Pre-oxygenation with 100% oxygen Induction Type: IV induction Ventilation: Mask ventilation without difficulty and Two handed mask ventilation required Laryngoscope Size: McGraph and 4 Grade View: Grade II Tube type: Oral Tube size: 7.5 mm Number of attempts: 1 Airway Equipment and Method: Stylet and Video-laryngoscopy Placement Confirmation: ETT inserted through vocal cords under direct vision, positive ETCO2 and breath sounds checked- equal and bilateral Secured at: 21 cm Tube secured with: Tape Dental Injury: Teeth and Oropharynx as per pre-operative assessment  Difficulty Due To: Difficult Airway- due to large tongue, Difficult Airway- due to limited oral opening, Difficulty was anticipated and Difficult Airway- due to reduced neck mobility

## 2022-12-02 NOTE — Discharge Instructions (Signed)
Removal, Care After This sheet gives you information about how to care for yourself after your procedure. Your health care provider may also give you more specific instructions. If you have problems or questions, contact your health care provider. What can I expect after the procedure? After the procedure, it is common to have: Soreness. Bruising. Itching. Follow these instructions at home: site care Follow instructions from your health care provider about how to take care of your site. Make sure you: Wash your hands with soap and water before and after you change your bandage (dressing). If soap and water are not available, use hand sanitizer. Leave stitches (sutures), skin glue, or adhesive strips in place. These skin closures may need to stay in place for 2 weeks or longer. If adhesive strip edges start to loosen and curl up, you may trim the loose edges. Do not remove adhesive strips completely unless your health care provider tells you to do that. If the area bleeds or bruises, apply gentle pressure for 10 minutes. OK TO SHOWER IN 24HRS  Check your site every day for signs of infection. Check for: Redness, swelling, or pain. Fluid or blood. Warmth. Pus or a bad smell.  General instructions Rest and then return to your normal activities as told by your health care provider.  tylenol and advil as needed for discomfort.  Please alternate between the two every four hours as needed for pain.    Use narcotics, if prescribed, only when tylenol and motrin is not enough to control pain.  325-650mg every 8hrs to max of 3000mg/24hrs (including the 325mg in every norco dose) for the tylenol.    Advil up to 800mg per dose every 8hrs as needed for pain.   Keep all follow-up visits as told by your health care provider. This is important. Contact a health care provider if: You have redness, swelling, or pain around your site. You have fluid or blood coming from your site. Your site feels warm to  the touch. You have pus or a bad smell coming from your site. You have a fever. Your sutures, skin glue, or adhesive strips loosen or come off sooner than expected. Get help right away if: You have bleeding that does not stop with pressure or a dressing. Summary After the procedure, it is common to have some soreness, bruising, and itching at the site. Follow instructions from your health care provider about how to take care of your site. Check your site every day for signs of infection. Contact a health care provider if you have redness, swelling, or pain around your site, or your site feels warm to the touch. Keep all follow-up visits as told by your health care provider. This is important. This information is not intended to replace advice given to you by your health care provider. Make sure you discuss any questions you have with your health care provider. Document Released: 08/29/2015 Document Revised: 01/30/2018 Document Reviewed: 01/30/2018 Elsevier Interactive Patient Education  2019 [employer]   

## 2022-12-02 NOTE — Transfer of Care (Signed)
Immediate Anesthesia Transfer of Care Note  Patient: Geoffrey Dalton  Procedure(s) Performed: CYST EXCISION PILONIDAL EXTENSIVE (Buttocks)  Patient Location: PACU  Anesthesia Type:General  Level of Consciousness: awake, alert , and oriented  Airway & Oxygen Therapy: Patient Spontanous Breathing and Patient connected to nasal cannula oxygen  Post-op Assessment: Report given to RN and Post -op Vital signs reviewed and stable  Post vital signs: Reviewed and stable  Last Vitals:  Vitals Value Taken Time  BP 133/97 12/02/22 1459  Temp    Pulse 94 12/02/22 1459  Resp 15 12/02/22 1459  SpO2 93 % 12/02/22 1459    Last Pain:  Vitals:   12/02/22 1135  TempSrc: Temporal  PainSc: 0-No pain         Complications:  Encounter Notable Events  Notable Event Outcome Phase Comment  Difficult to intubate - expected  Intraprocedure Filed from anesthesia note documentation.

## 2022-12-03 ENCOUNTER — Encounter: Payer: Self-pay | Admitting: Surgery

## 2022-12-03 NOTE — Anesthesia Postprocedure Evaluation (Signed)
Anesthesia Post Note  Patient: Geoffrey Dalton  Procedure(s) Performed: CYST EXCISION PILONIDAL EXTENSIVE (Buttocks)  Patient location during evaluation: PACU Anesthesia Type: General Level of consciousness: awake and alert, oriented and patient cooperative Pain management: pain level controlled Vital Signs Assessment: post-procedure vital signs reviewed and stable Respiratory status: spontaneous breathing, nonlabored ventilation and respiratory function stable Cardiovascular status: blood pressure returned to baseline and stable Postop Assessment: adequate PO intake Anesthetic complications: yes   Encounter Notable Events  Notable Event Outcome Phase Comment  Difficult to intubate - expected  Intraprocedure Filed from anesthesia note documentation.     Last Vitals:  Vitals:   12/02/22 1545 12/02/22 1602  BP: 106/72 (!) 140/86  Pulse: 89 91  Resp: 10 16  Temp: (!) 36.1 C (!) 36.3 C  SpO2: 93% 93%    Last Pain:  Vitals:   12/02/22 1602  TempSrc: Temporal  PainSc: 3                  Reed Breech

## 2022-12-06 LAB — SURGICAL PATHOLOGY

## 2022-12-13 ENCOUNTER — Encounter: Payer: Self-pay | Admitting: Surgery

## 2023-05-27 ENCOUNTER — Inpatient Hospital Stay
Admission: RE | Admit: 2023-05-27 | Discharge: 2023-05-27 | Disposition: A | Discharge status: Home / Self Care | Payer: Self-pay | Source: Ambulatory Visit | Attending: Orthopedic Surgery | Admitting: Orthopedic Surgery

## 2023-05-27 ENCOUNTER — Other Ambulatory Visit: Payer: Self-pay

## 2023-05-27 DIAGNOSIS — Z049 Encounter for examination and observation for unspecified reason: Secondary | ICD-10-CM
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
## 2023-05-30 ENCOUNTER — Ambulatory Visit: Payer: Managed Care, Other (non HMO) | Admitting: Orthopedic Surgery
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
## 2023-06-06 ENCOUNTER — Ambulatory Visit: Payer: Managed Care, Other (non HMO) | Admitting: Orthopedic Surgery

## 2023-06-06 ENCOUNTER — Encounter: Payer: Self-pay | Admitting: Orthopedic Surgery

## 2023-06-06 VITALS — BP 114/76 | Ht 75.0 in | Wt 382.0 lb

## 2023-06-06 DIAGNOSIS — M5416 Radiculopathy, lumbar region: Secondary | ICD-10-CM

## 2023-06-06 DIAGNOSIS — G8929 Other chronic pain: Secondary | ICD-10-CM

## 2023-06-06 DIAGNOSIS — M545 Low back pain, unspecified: Secondary | ICD-10-CM | POA: Diagnosis not present

## 2023-06-06 DIAGNOSIS — Z8781 Personal history of (healed) traumatic fracture: Secondary | ICD-10-CM

## 2023-06-06 NOTE — Patient Instructions (Signed)
It was so nice to see you today. Thank you so much for coming in.    I want to get an MRI of your lower back to look into things further. We will get this approved through your insurance and [facility] will call you to schedule the appointment. I want to get some lumbar xrays when you get the MRI as well.   After you have the MRI, it takes 10-14 days for me to get the results back. Once I have them, we will call you to schedule a follow up phone visit with me to review them.   We will work on getting your records from [facility].   Please do not hesitate to call if you have any questions or concerns. You can also message me in MyChart.   Drake Leach PA-C [phone]     The physicians and staff at [facility] at [facility] are committed to providing excellent care. You may receive a survey asking for feedback about your experience at our office. We value you your feedback and appreciate you taking the time to to fill it out. The [facility] leadership team is also available to discuss your experience in person, feel free to contact us [phone].
